# Patient Record
Sex: Male | Born: 1965 | Race: Black or African American | Hispanic: No | Marital: Single | Smoking: Light tobacco smoker
Health system: Southern US, Community
[De-identification: ages and names within clinical notes are randomized; demographics above are authoritative.]

## PROBLEM LIST (undated history)

## (undated) HISTORY — PX: FRACTURE SURGERY: SHX138

---

## 2017-04-08 ENCOUNTER — Encounter (HOSPITAL_COMMUNITY): Payer: Self-pay | Admitting: Nurse Practitioner

## 2017-04-08 ENCOUNTER — Emergency Department (HOSPITAL_COMMUNITY)
Admission: EM | Admit: 2017-04-08 | Discharge: 2017-04-08 | Disposition: A | Discharge status: Home / Self Care | Payer: Self-pay | Attending: Emergency Medicine | Admitting: Emergency Medicine

## 2017-04-08 DIAGNOSIS — R600 Localized edema: Secondary | ICD-10-CM | POA: Insufficient documentation

## 2017-04-08 DIAGNOSIS — F17200 Nicotine dependence, unspecified, uncomplicated: Secondary | ICD-10-CM | POA: Insufficient documentation

## 2017-04-08 DIAGNOSIS — K047 Periapical abscess without sinus: Secondary | ICD-10-CM | POA: Insufficient documentation

## 2017-04-08 MED ORDER — PENICILLIN V POTASSIUM 500 MG PO TABS
500.0000 mg | ORAL_TABLET | Freq: Once | ORAL | Status: AC
  Administered 2017-04-08: 500 mg via ORAL
  Filled 2017-04-08: qty 1

## 2017-04-08 MED ORDER — HYDROCODONE-ACETAMINOPHEN 5-325 MG PO TABS
1.0000 | ORAL_TABLET | Freq: Once | ORAL | Status: AC
  Administered 2017-04-08: 1 via ORAL
  Filled 2017-04-08: qty 1

## 2017-04-08 MED ORDER — HYDROCODONE-ACETAMINOPHEN 5-325 MG PO TABS: 1.0000 | ORAL_TABLET | ORAL | 0 refills | Status: AC | PRN

## 2017-04-08 MED ORDER — IBUPROFEN 600 MG PO TABS: 600.0000 mg | ORAL_TABLET | Freq: Four times a day (QID) | ORAL | 0 refills | Status: AC | PRN

## 2017-04-08 MED ORDER — PENICILLIN V POTASSIUM 500 MG PO TABS: 500.0000 mg | ORAL_TABLET | Freq: Three times a day (TID) | ORAL | 0 refills | Status: AC

## 2017-04-08 NOTE — ED Provider Notes (Signed)
[facility]-EMERGENCY DEPT Provider Note   CSN: [id number] Arrival date & time: 04/08/17  0148     History   Chief Complaint Chief Complaint  Patient presents with  . Dental Pain  . Facial Swelling    HPI Mathew Mckenzie is a 51 y.o. male.  Patient presents with dental pain x 1 week and facial swelling x 1 day. No difficulty swallowing or c/o SOB. No fever.    The history is provided by the patient. No language interpreter was used.  Dental Pain      History reviewed. No pertinent past medical history.  There are no active problems to display for this patient.   History reviewed. No pertinent surgical history.     Home Medications    Prior to Admission medications   Not on File    Family History History reviewed. No pertinent family history.  Social History Social History  Substance Use Topics  . Smoking status: Current Some Day Smoker  . Smokeless tobacco: Never Used  . Alcohol use Yes     Allergies   Patient has no known allergies.   Review of Systems Review of Systems  Constitutional: Negative for chills and fever.  HENT: Positive for facial swelling. Negative for ear pain and trouble swallowing.        Toothache.  Respiratory: Negative for shortness of breath.   Gastrointestinal: Negative for nausea.  Neurological: Negative for headaches.     Physical Exam Updated Vital Signs BP (!) 142/87 (BP Location: Left Arm)   Pulse 64   Temp 98.6 F (37 C) (Oral)   Resp 16   Ht 5\' 8"  (1.727 m)   Wt 70.3 kg (155 lb)   SpO2 96%   BMI 23.57 kg/m   Physical Exam  Constitutional: He is oriented to person, place, and time. He appears well-developed and well-nourished.  HENT:  Mouth/Throat: Oropharynx is clear and moist.  Right sided maxillary facial swelling that is tender. Swelling associated with #2, missing #3.   Neck: Normal range of motion.  Pulmonary/Chest: Effort normal.  Musculoskeletal: Normal range of motion.  Lymphadenopathy:   He has no cervical adenopathy.  Neurological: He is alert and oriented to person, place, and time.  Skin: Skin is warm and dry.  Psychiatric: He has a normal mood and affect.     ED Treatments / Results  Labs (all labs ordered are listed, but only abnormal results are displayed) Labs Reviewed - No data to display  EKG  EKG Interpretation None       Radiology No results found.  Procedures Procedures (including critical care time)  Medications Ordered in ED Medications  penicillin v potassium (VEETID) tablet 500 mg (not administered)  HYDROcodone-acetaminophen (NORCO/VICODIN) 5-325 MG per tablet 1 tablet (not administered)     Initial Impression / Assessment and Plan / ED Course  I have reviewed the triage vital signs and the nursing notes.  Pertinent labs & imaging results that were available during my care of the patient were reviewed by me and considered in my medical decision making (see chart for details).     Facial swelling felt associated with dental abscess. Patient has normal VS, no report of fever, no difficulty swallowing. He can be discharged home on abx, pain control and dental follow up.  Final Clinical Impressions(s) / ED Diagnoses   Final diagnoses:  None   1. Dental infection  New Prescriptions New Prescriptions   No medications on file     Elpidio AnisUpstill, Jalyn Rosero,  PA-C 04/08/17 0440    Melene Plan, DO 04/08/17 2130

## 2017-04-08 NOTE — ED Notes (Signed)
Bed: WA22 Expected date:  Expected time:  Means of arrival:  Comments: 

## 2017-04-08 NOTE — ED Notes (Signed)
Pt has had a cavity in his mouth for a couple months and is presenting today with inflammation and pain on the upper right side of the mouth.

## 2017-04-08 NOTE — ED Triage Notes (Signed)
Pt is c/o dental pain that has been ongoing for a week and caused him right sided facial swelling.

## 2017-05-24 ENCOUNTER — Encounter (HOSPITAL_COMMUNITY): Payer: Self-pay | Admitting: Emergency Medicine

## 2017-05-24 ENCOUNTER — Emergency Department (HOSPITAL_COMMUNITY)
Admission: EM | Admit: 2017-05-24 | Discharge: 2017-05-24 | Disposition: A | Discharge status: Home / Self Care | Payer: Self-pay | Attending: Emergency Medicine | Admitting: Emergency Medicine

## 2017-05-24 ENCOUNTER — Emergency Department (HOSPITAL_COMMUNITY): Payer: Self-pay

## 2017-05-24 DIAGNOSIS — F17200 Nicotine dependence, unspecified, uncomplicated: Secondary | ICD-10-CM | POA: Insufficient documentation

## 2017-05-24 DIAGNOSIS — S0990XA Unspecified injury of head, initial encounter: Secondary | ICD-10-CM | POA: Insufficient documentation

## 2017-05-24 DIAGNOSIS — Y929 Unspecified place or not applicable: Secondary | ICD-10-CM | POA: Insufficient documentation

## 2017-05-24 DIAGNOSIS — Y999 Unspecified external cause status: Secondary | ICD-10-CM | POA: Insufficient documentation

## 2017-05-24 DIAGNOSIS — S161XXA Strain of muscle, fascia and tendon at neck level, initial encounter: Secondary | ICD-10-CM | POA: Insufficient documentation

## 2017-05-24 DIAGNOSIS — Y939 Activity, unspecified: Secondary | ICD-10-CM | POA: Insufficient documentation

## 2017-05-24 DIAGNOSIS — R42 Dizziness and giddiness: Secondary | ICD-10-CM | POA: Insufficient documentation

## 2017-05-24 DIAGNOSIS — R531 Weakness: Secondary | ICD-10-CM | POA: Insufficient documentation
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
## 2017-05-24 NOTE — Discharge Instructions (Signed)
Ibuprofen 600 mg every 6 hours as needed for pain.  Return to the emergency department for worsening headache, increased confusion, difficulty waking, or other new and concerning symptoms.
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
## 2018-02-28 DIAGNOSIS — F17210 Nicotine dependence, cigarettes, uncomplicated: Secondary | ICD-10-CM | POA: Insufficient documentation

## 2018-02-28 DIAGNOSIS — R072 Precordial pain: Secondary | ICD-10-CM | POA: Insufficient documentation

## 2018-03-01 ENCOUNTER — Other Ambulatory Visit: Payer: Self-pay

## 2018-03-01 ENCOUNTER — Encounter (HOSPITAL_COMMUNITY): Payer: Self-pay | Admitting: Emergency Medicine

## 2018-03-01 ENCOUNTER — Emergency Department (HOSPITAL_COMMUNITY)
Admission: EM | Admit: 2018-03-01 | Discharge: 2018-03-01 | Disposition: A | Discharge status: Home / Self Care | Payer: Self-pay | Attending: Emergency Medicine | Admitting: Emergency Medicine

## 2018-03-01 ENCOUNTER — Emergency Department (HOSPITAL_COMMUNITY): Payer: Self-pay

## 2018-03-01 DIAGNOSIS — R072 Precordial pain: Secondary | ICD-10-CM

## 2018-03-01 LAB — CBC
HEMATOCRIT: 43.7 % (ref 39.0–52.0)
Hemoglobin: 14.5 g/dL (ref 13.0–17.0)
MCH: 23.8 pg — ABNORMAL LOW (ref 26.0–34.0)
MCHC: 33.2 g/dL (ref 30.0–36.0)
MCV: 71.9 fL — ABNORMAL LOW (ref 78.0–100.0)
Platelets: 426 10*3/uL — ABNORMAL HIGH (ref 150–400)
RBC: 6.08 MIL/uL — ABNORMAL HIGH (ref 4.22–5.81)
RDW: 15.1 % (ref 11.5–15.5)
WBC: 9.5 10*3/uL (ref 4.0–10.5)

## 2018-03-01 LAB — BASIC METABOLIC PANEL
Anion gap: 12 (ref 5–15)
BUN: 17 mg/dL (ref 6–20)
CO2: 27 mmol/L (ref 22–32)
Calcium: 9.5 mg/dL (ref 8.9–10.3)
Chloride: 102 mmol/L (ref 101–111)
Creatinine, Ser: 1.37 mg/dL — ABNORMAL HIGH (ref 0.61–1.24)
GFR calc Af Amer: >60 mL/min (ref >60)
GFR calc non Af Amer: 58 mL/min — ABNORMAL LOW (ref >60)
Glucose, Bld: 104 mg/dL — ABNORMAL HIGH (ref 65–99)
Potassium: 3.7 mmol/L (ref 3.5–5.1)
Sodium: 141 mmol/L (ref 135–145)

## 2018-03-01 LAB — I-STAT TROPONIN, ED
TROPONIN I, POC: 0 ng/mL (ref 0.00–0.08)
Troponin i, poc: 0.01 ng/mL (ref 0.00–0.08)

## 2018-03-01 LAB — D-DIMER, QUANTITATIVE: D-Dimer, Quant: <0.27 ug/mL-FEU (ref 0.00–0.50)

## 2018-03-01 NOTE — ED Notes (Signed)
Pt reports to using Cocaine 2 days ago.

## 2018-03-01 NOTE — ED Triage Notes (Signed)
Pt reports having chest pain that started 3 hour prior to arrival.

## 2018-03-01 NOTE — ED Provider Notes (Signed)
[facility]-EMERGENCY DEPT Provider Note   CSN: [id number] Arrival date & time: 02/28/18  2358     History   Chief Complaint Chief Complaint  Patient presents with  . Chest Pain    HPI Mathew Mckenzie is a 52 y.o. male.  HPI Patient reports chest pain that developed 3 hours prior to arrival.  It was sharp and nature.  It was somewhat pleuritic.  It was constant for several hours.  Is now resolved.  No history of DVT or pulmonary embolism.  No history of coronary artery disease.  He does admit to recent cocaine use.  No fevers or chills.  No productive cough.  Pain was moderate in severity.  Pain is 0 out of 10 at this time.    History reviewed. No pertinent past medical history.  There are no active problems to display for this patient.   History reviewed. No pertinent surgical history.      Home Medications    Prior to Admission medications   Medication Sig Start Date End Date Taking? Authorizing Provider  HYDROcodone-acetaminophen (NORCO/VICODIN) 5-325 MG tablet Take 1 tablet by mouth every 4 (four) hours as needed. Patient not taking: Reported on 05/24/2017 04/08/17   Elpidio Anis, PA-C  ibuprofen (ADVIL,MOTRIN) 200 MG tablet Take 400-800 mg by mouth every 6 (six) hours as needed for moderate pain.    [provider]  ibuprofen (ADVIL,MOTRIN) 600 MG tablet Take 1 tablet (600 mg total) by mouth every 6 (six) hours as needed. Patient not taking: Reported on 05/24/2017 04/08/17   Elpidio Anis, PA-C  penicillin v potassium (VEETID) 500 MG tablet Take 1 tablet (500 mg total) by mouth 3 (three) times daily. Patient not taking: Reported on 05/24/2017 04/08/17   Elpidio Anis, PA-C    Family History History reviewed. No pertinent family history.  Social History Social History   Tobacco Use  . Smoking status: Current Some Day Smoker    Packs/day: 0.50    Types: Cigarettes  . Smokeless tobacco: Never Used  Substance Use Topics  .  Alcohol use: Yes  . Drug use: No     Allergies   Patient has no known allergies.   Review of Systems Review of Systems  All other systems reviewed and are negative.    Physical Exam Updated Vital Signs BP (!) 140/97 (BP Location: Left Arm)   Pulse 91   Temp 98 F (36.7 C) (Oral)   Resp (!) 21   Ht  (1.727 m)   Wt 70.3 kg (155 lb)   SpO2 100%   BMI 23.57 kg/m   Physical Exam  Constitutional: He is oriented to person, place, and time. He appears well-developed and well-nourished.  HENT:  Head: Normocephalic and atraumatic.  Eyes: EOM are normal.  Neck: Normal range of motion.  Cardiovascular: Normal rate, regular rhythm, normal heart sounds and intact distal pulses.  Pulmonary/Chest: Effort normal and breath sounds normal. No respiratory distress.  Abdominal: Soft. He exhibits no distension. There is no tenderness.  Musculoskeletal: Normal range of motion.  Neurological: He is alert and oriented to person, place, and time.  Skin: Skin is warm and dry.  Psychiatric: He has a normal mood and affect. Judgment normal.  Nursing note and vitals reviewed.    ED Treatments / Results  Labs (all labs ordered are listed, but only abnormal results are displayed) Labs Reviewed  BASIC METABOLIC PANEL - Abnormal; Notable for the following components:      Result  Value   Glucose, Bld 104 (*)    Creatinine, Ser 1.37 (*)    GFR calc non Af Amer 58 (*)    All other components within normal limits  CBC - Abnormal; Notable for the following components:   RBC 6.08 (*)    MCV 71.9 (*)    MCH 23.8 (*)    Platelets 426 (*)    All other components within normal limits  D-DIMER, QUANTITATIVE (NOT AT [facility])  I-STAT TROPONIN, ED  I-STAT TROPONIN, ED    EKG EKG Interpretation  Date/Time:  Thursday Mar 01 2018 00:06:37 EDT Ventricular Rate:  112 PR Interval:    QRS Duration: 86 QT Interval:  324 QTC Calculation: 443 R Axis:   82 Text Interpretation:  Sinus tachycardia  Biatrial enlargement Probable left ventricular hypertrophy No old tracing to compare Confirmed by Azalia Bilis ([id number]) on 03/01/2018 2:19:27 AM   Radiology Dg Chest 2 View  Result Date: 03/01/2018 CLINICAL DATA:  52 year old male with chest pain. EXAM: CHEST - 2 VIEW COMPARISON:  None. FINDINGS: Mild chronic bronchitic changes. No focal consolidation, pleural effusion, or pneumothorax. The cardiac silhouette is within normal limits. No acute osseous pathology. IMPRESSION: No active cardiopulmonary disease. Electronically Signed   By: Elgie Collard M.D.   On: 03/01/2018 01:40    Procedures Procedures (including critical care time)  Medications Ordered in ED Medications - No data to display   Initial Impression / Assessment and Plan / ED Course  I have reviewed the triage vital signs and the nursing notes.  Pertinent labs & imaging results that were available during my care of the patient were reviewed by me and considered in my medical decision making (see chart for details).     EKG without ischemic changes.  Troponin negative x2.  D-dimer is negative.  Nonspecific chest pain.  No indication for additional work-up at this time.  Doubt dissection.  Overall well-appearing.  Chest pain resolved.  Primary care follow-up.  Patient understands return to the ER for new or worsening symptoms.  Personally reviewed the patient's chest x-ray which demonstrates no acute radiographic findings of acute cardiopulmonary abnormalities.    Final Clinical Impressions(s) / ED Diagnoses   Final diagnoses:  Precordial chest pain    ED Discharge Orders    None       Azalia Bilis, MD 03/01/18 [phone]

## 2018-03-16 ENCOUNTER — Emergency Department (HOSPITAL_COMMUNITY): Admission: EM | Admit: 2018-03-16 | Discharge: 2018-03-16 | Discharge status: Against Medical Advice | Payer: Self-pay

## 2018-03-16 NOTE — ED Notes (Signed)
No answer for traige x 3, per registration pt stepped outside several minutes ago and has not returned

## 2018-05-12 ENCOUNTER — Other Ambulatory Visit: Payer: Self-pay

## 2018-05-12 ENCOUNTER — Encounter (HOSPITAL_COMMUNITY): Payer: Self-pay | Admitting: Emergency Medicine

## 2018-05-12 ENCOUNTER — Emergency Department (HOSPITAL_COMMUNITY)
Admission: EM | Admit: 2018-05-12 | Discharge: 2018-05-12 | Disposition: A | Discharge status: Home / Self Care | Payer: Self-pay | Attending: Emergency Medicine | Admitting: Emergency Medicine

## 2018-05-12 DIAGNOSIS — F17210 Nicotine dependence, cigarettes, uncomplicated: Secondary | ICD-10-CM | POA: Insufficient documentation

## 2018-05-12 DIAGNOSIS — M25561 Pain in right knee: Secondary | ICD-10-CM | POA: Insufficient documentation

## 2018-05-12 DIAGNOSIS — M545 Low back pain: Secondary | ICD-10-CM | POA: Insufficient documentation

## 2018-05-12 DIAGNOSIS — G8929 Other chronic pain: Secondary | ICD-10-CM

## 2018-05-12 MED ORDER — MELOXICAM 7.5 MG PO TABS: 7.5000 mg | ORAL_TABLET | Freq: Every day | ORAL | 0 refills | Status: AC

## 2018-05-12 NOTE — ED Provider Notes (Signed)
[facility] Provider Note   CSN: [id number] Arrival date & time: 05/12/18  1548     History   Chief Complaint Chief Complaint  Patient presents with  . Knee Pain  . Back Pain    HPI Mathew Mckenzie is a 52 y.o. male who presents today for evaluation of chronic back and right knee pain.  He reports that he just got out of jail where he was incarcerated for 6 weeks and he was forced to be in a top bunk.  He reports that he has an old injury to his knee and since then his knee and back pain have both worsened.  They are unchanged from his usual pain and location or character, they are simply just worse.  They are made worse with walking, movement, and touch.  No fevers or chills.  He denies any changes in bowel or bladder function.  His back pain does not radiate.  HPI  History reviewed. No pertinent past medical history.  Patient Active Problem List   Diagnosis Date Noted  . Chronic pain of left knee 05/12/2018  . Chronic bilateral low back pain without sciatica 05/12/2018    Past Surgical History:  Procedure Laterality Date  . FRACTURE SURGERY          Home Medications    Prior to Admission medications   Medication Sig Start Date End Date Taking? Authorizing Provider  HYDROcodone-acetaminophen (NORCO/VICODIN) 5-325 MG tablet Take 1 tablet by mouth every 4 (four) hours as needed. Patient not taking: Reported on 05/24/2017 04/08/17   Elpidio AnisUpstill, Shari, PA-C  ibuprofen (ADVIL,MOTRIN) 200 MG tablet Take 400-800 mg by mouth every 6 (six) hours as needed for moderate pain.    [provider]  ibuprofen (ADVIL,MOTRIN) 600 MG tablet Take 1 tablet (600 mg total) by mouth every 6 (six) hours as needed. Patient not taking: Reported on 05/24/2017 04/08/17   Elpidio AnisUpstill, Shari, PA-C  meloxicam (MOBIC) 7.5 MG tablet Take 1 tablet (7.5 mg total) by mouth daily. 05/12/18   Cristina GongHammond, Ivey Cina W, PA-C  penicillin v potassium (VEETID) 500 MG tablet  Take 1 tablet (500 mg total) by mouth 3 (three) times daily. Patient not taking: Reported on 05/24/2017 04/08/17   Elpidio AnisUpstill, Shari, PA-C    Family History History reviewed. No pertinent family history.  Social History Social History   Tobacco Use  . Smoking status: Light Tobacco Smoker    Packs/day: 0.50    Types: Cigarettes  . Smokeless tobacco: Never Used  Substance Use Topics  . Alcohol use: Not Currently  . Drug use: No     Allergies   Patient has no known allergies.   Review of Systems Review of Systems  Genitourinary: Negative for decreased urine volume and difficulty urinating.  Musculoskeletal: Positive for back pain.       Right knee pain  All other systems reviewed and are negative.    Physical Exam Updated Vital Signs BP 131/88 (BP Location: Right Arm)   Pulse 90   Temp 99.1 F (37.3 C) (Oral)   Resp 14   Ht 5\' 8"  (1.727 m)   Wt 68 kg (150 lb)   SpO2 99%   BMI 22.81 kg/m   Physical Exam  Constitutional: He appears well-developed.  Cardiovascular: Intact distal pulses.  Bilateral legs are warm, well-perfused, 2+ DP pulses bilaterally.  Musculoskeletal:  Right knee has limited range of motion secondary to pain.  There is no abnormal swelling, induration, redness over the right  knee.  Right knee is grossly stable to my exam.    Back is generally tender to palpation, primarily along midline in the lower back and on the right sided paraspinal muscles.  No crepitus or deformities.  Neurological:  Sensation intact to bilateral lower extremities.  5/5 strength bilaterally with ankle dorsiflexion and plantar flexion.  Skin: He is not diaphoretic.  There is a large scar over the right knee.  There is no abnormal erythema, induration, fluctuance or wound over the right knee.  Nursing note and vitals reviewed.    ED Treatments / Results  Labs (all labs ordered are listed, but only abnormal results are displayed) Labs Reviewed - No data to  display  EKG None  Radiology No results found.  Procedures Procedures (including critical care time)  Medications Ordered in ED Medications - No data to display   Initial Impression / Assessment and Plan / ED Course  I have reviewed the triage vital signs and the nursing notes.  Pertinent labs & imaging results that were available during my care of the patient were reviewed by me and considered in my medical decision making (see chart for details).    Patient presents today for evaluation of worsening of his chronic knee and back pain.  He attributes this to being incarcerated for the past 6 weeks where he was in a top bunk and had to climb up and down.  Patient initially requested Percocet for his pain.  Discussed that this was not an appropriate choice and will instead give him Mobic.  He denies any just a bowel or bladder function.  He reports that he knows this is his chronic pains just worse and has not had any new injuries.  Given his history low suspicion for septic arthritis, or epidural abscess.  Discussed rice, Mobic, and Tylenol with patient.  Follow-up with orthopedics and at the [facility].  Return precautions discussed.  Discharged home.  Final Clinical Impressions(s) / ED Diagnoses   Final diagnoses:  Chronic bilateral low back pain without sciatica  Chronic pain of left knee    ED Discharge Orders        Ordered    meloxicam (MOBIC) 7.5 MG tablet  Daily     05/12/18 1759       Cristina Gong, Cordelia Poche 05/12/18 1813    Benjiman Core, MD 05/13/18 [phone]

## 2018-05-12 NOTE — ED Triage Notes (Signed)
Pt presents with chronic pain to R knee after surgery in 2017 and lower back pain without radiation or weaknes; pt also states he was just released from prison

## 2018-05-12 NOTE — Discharge Instructions (Addendum)
I have given you a prescription for Mobic (meloxicam) today.  Mobic is a NSAID medication and you should not take it with other NSAIDs.  Examples of other NSAIDS include motrin, ibuprofen, aleve, naproxen, and Voltaren.  Please monitor your bowel movements for dark, tarry, sticky stools. If you have any bowel movements like this you need to stop taking mobic and call your doctor as this may represent a stomach ulcer from taking NSAIDS.    Please take Tylenol (acetaminophen) to relieve your pain.  You may take tylenol, up to 1,000 mg (two extra strength pills).  Do not take more than 3,000 mg tylenol in a 24 hour period.  Please check all medication labels as many medications such as pain and cold medications may contain tylenol. Please do not drink alcohol while taking this medication.   

## 2019-06-10 IMAGING — CR DG CHEST 2V
2 series · 2 of 2 positions shown · non-contrast
Comparison: None.

CLINICAL DATA: 59-year-old male with chest pain.

EXAM:
CHEST - 2 VIEW

[w chest pa]
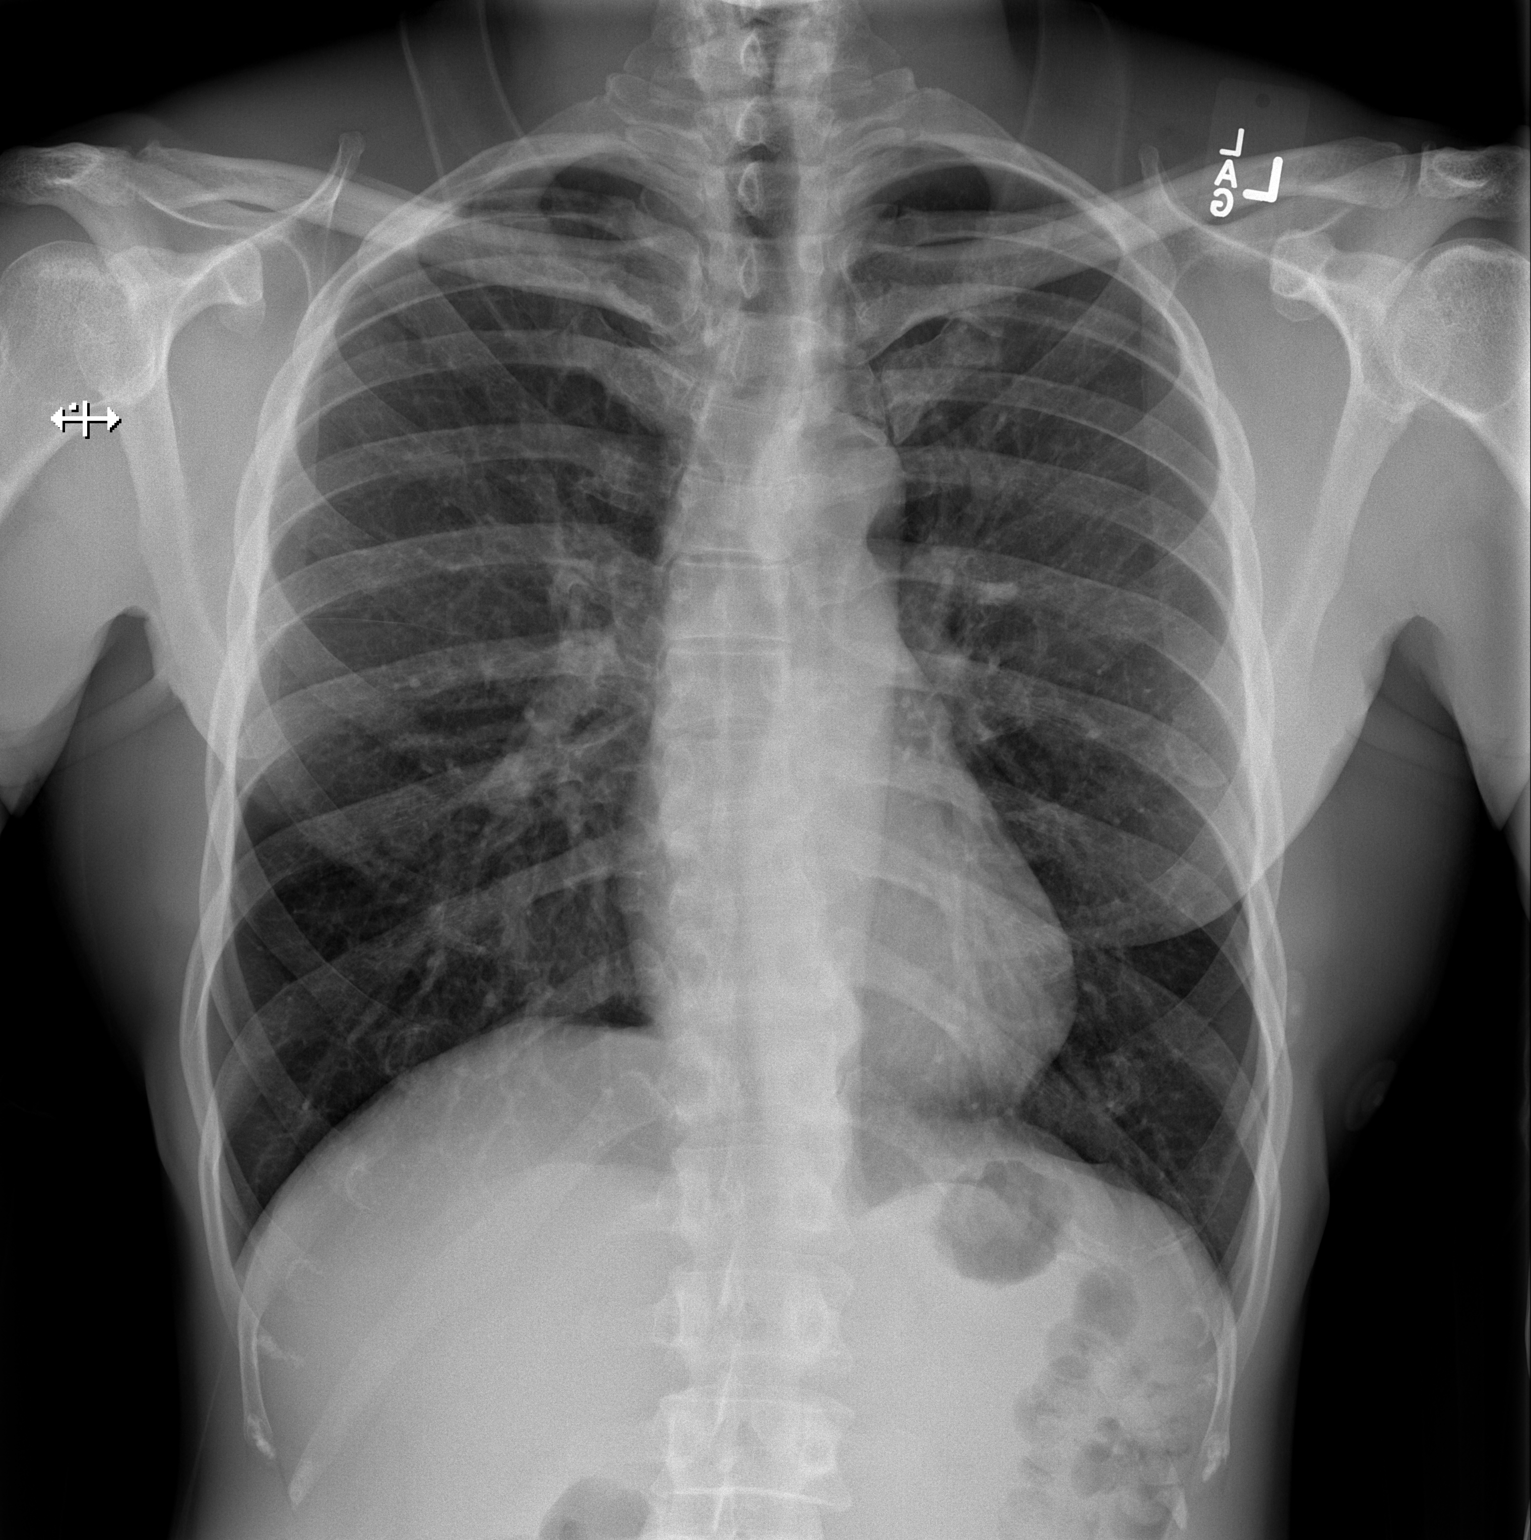

[w chest lat]
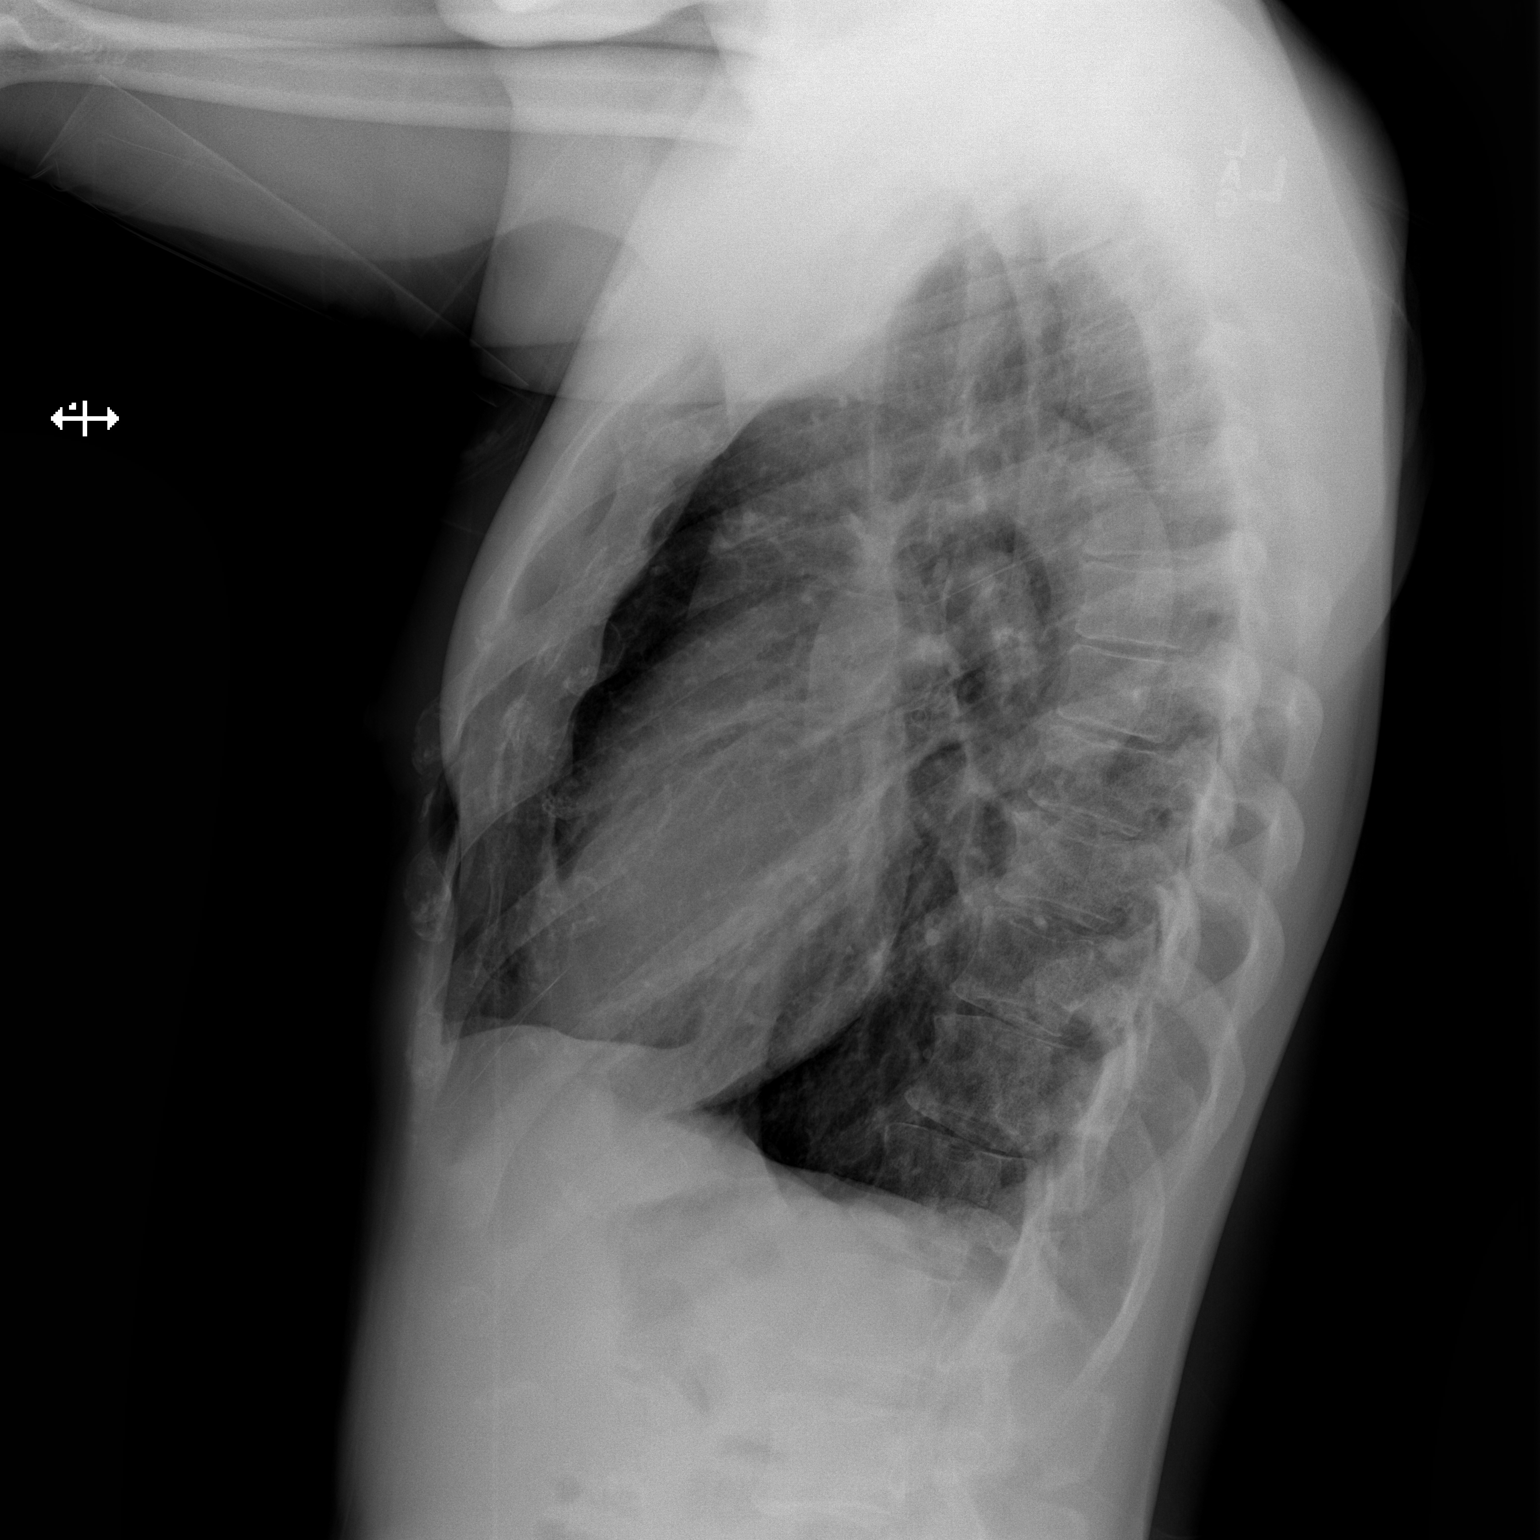

[2 of 2 positions shown; findings below may reference images not displayed]

FINDINGS: Mild chronic bronchitic changes. No focal consolidation, pleural
effusion, or pneumothorax. The cardiac silhouette is within normal
limits. No acute osseous pathology.
IMPRESSION: No active cardiopulmonary disease.
